# Patient Record
Sex: Female | Born: 1965 | Hispanic: Refuse to answer | Smoking: Never smoker
Health system: Southern US, Community
[De-identification: ages and names within clinical notes are randomized; demographics above are authoritative.]

## PROBLEM LIST (undated history)

## (undated) DIAGNOSIS — N200 Calculus of kidney: Secondary | ICD-10-CM

## (undated) DIAGNOSIS — D696 Thrombocytopenia, unspecified: Principal | ICD-10-CM

## (undated) HISTORY — PX: TUBAL LIGATION: SHX77

## (undated) HISTORY — PX: LITHOTRIPSY: SUR834

## (undated) HISTORY — PX: APPENDECTOMY: SHX54

## (undated) HISTORY — DX: Thrombocytopenia, unspecified: D69.6

## (undated) HISTORY — DX: Calculus of kidney: N20.0

---

## 2009-03-26 ENCOUNTER — Emergency Department (HOSPITAL_COMMUNITY): Admission: EM | Admit: 2009-03-26 | Discharge: 2009-03-27 | Payer: Self-pay | Admitting: Emergency Medicine

## 2010-05-01 LAB — URINALYSIS, ROUTINE W REFLEX MICROSCOPIC
Glucose, UA: NEGATIVE mg/dL
Nitrite: NEGATIVE
Protein, ur: NEGATIVE mg/dL

## 2010-11-17 ENCOUNTER — Emergency Department (HOSPITAL_COMMUNITY)
Admission: EM | Admit: 2010-11-17 | Discharge: 2010-11-18 | Disposition: A | Discharge status: Other Acute Inpatient Hospital | Payer: Self-pay | Source: Home / Self Care | Attending: Emergency Medicine | Admitting: Emergency Medicine

## 2010-11-17 DIAGNOSIS — R142 Eructation: Secondary | ICD-10-CM | POA: Insufficient documentation

## 2010-11-17 DIAGNOSIS — R109 Unspecified abdominal pain: Secondary | ICD-10-CM | POA: Insufficient documentation

## 2010-11-17 DIAGNOSIS — R143 Flatulence: Secondary | ICD-10-CM | POA: Insufficient documentation

## 2010-11-17 DIAGNOSIS — R630 Anorexia: Secondary | ICD-10-CM | POA: Insufficient documentation

## 2010-11-17 DIAGNOSIS — R10819 Abdominal tenderness, unspecified site: Secondary | ICD-10-CM | POA: Insufficient documentation

## 2010-11-17 DIAGNOSIS — R112 Nausea with vomiting, unspecified: Secondary | ICD-10-CM | POA: Insufficient documentation

## 2010-11-17 DIAGNOSIS — R5383 Other fatigue: Secondary | ICD-10-CM | POA: Insufficient documentation

## 2010-11-17 DIAGNOSIS — N201 Calculus of ureter: Secondary | ICD-10-CM | POA: Insufficient documentation

## 2010-11-17 DIAGNOSIS — R141 Gas pain: Secondary | ICD-10-CM | POA: Insufficient documentation

## 2010-11-17 DIAGNOSIS — R5381 Other malaise: Secondary | ICD-10-CM | POA: Insufficient documentation

## 2010-11-17 DIAGNOSIS — K8020 Calculus of gallbladder without cholecystitis without obstruction: Secondary | ICD-10-CM | POA: Insufficient documentation

## 2010-11-17 DIAGNOSIS — N12 Tubulo-interstitial nephritis, not specified as acute or chronic: Secondary | ICD-10-CM | POA: Insufficient documentation

## 2010-11-17 LAB — DIFFERENTIAL
Basophils Relative: 0 % (ref 0–1)
Eosinophils Relative: 1 % (ref 0–5)
Lymphocytes Relative: 6 % — ABNORMAL LOW (ref 12–46)
Lymphs Abs: 0.9 10*3/uL (ref 0.7–4.0)
Monocytes Relative: 6 % (ref 3–12)
Neutro Abs: 13.2 10*3/uL — ABNORMAL HIGH (ref 1.7–7.7)
Neutrophils Relative %: 87 % — ABNORMAL HIGH (ref 43–77)

## 2010-11-17 LAB — BASIC METABOLIC PANEL
BUN: 22 mg/dL (ref 6–23)
CO2: 21 mEq/L (ref 19–32)
GFR calc Af Amer: 50 mL/min — ABNORMAL LOW (ref >90)
Glucose, Bld: 129 mg/dL — ABNORMAL HIGH (ref 70–99)
Potassium: 3.2 mEq/L — ABNORMAL LOW (ref 3.5–5.1)

## 2010-11-18 ENCOUNTER — Emergency Department (HOSPITAL_COMMUNITY): Payer: Self-pay

## 2010-11-18 ENCOUNTER — Inpatient Hospital Stay (HOSPITAL_COMMUNITY)
Admission: AD | Admit: 2010-11-18 | Discharge: 2010-11-19 | DRG: 694 | Disposition: A | Discharge status: Home / Self Care | Payer: Self-pay | Source: Other Acute Inpatient Hospital | Attending: Urology | Admitting: Urology

## 2010-11-18 DIAGNOSIS — N1330 Unspecified hydronephrosis: Secondary | ICD-10-CM | POA: Diagnosis present

## 2010-11-18 DIAGNOSIS — N201 Calculus of ureter: Principal | ICD-10-CM | POA: Diagnosis present

## 2010-11-18 DIAGNOSIS — R109 Unspecified abdominal pain: Secondary | ICD-10-CM | POA: Diagnosis present

## 2010-11-18 DIAGNOSIS — N390 Urinary tract infection, site not specified: Secondary | ICD-10-CM | POA: Diagnosis present

## 2010-11-18 LAB — CBC
MCHC: 34 g/dL (ref 30.0–36.0)
RBC: 4.26 MIL/uL (ref 3.87–5.11)
RDW: 13 % (ref 11.5–15.5)

## 2010-11-18 LAB — HEPATIC FUNCTION PANEL
ALT: 57 U/L — ABNORMAL HIGH (ref 0–35)
AST: 53 U/L — ABNORMAL HIGH (ref 0–37)
Bilirubin, Direct: 0.7 mg/dL — ABNORMAL HIGH (ref 0.0–0.3)
Total Bilirubin: 1.3 mg/dL — ABNORMAL HIGH (ref 0.3–1.2)

## 2010-11-18 LAB — URINALYSIS, ROUTINE W REFLEX MICROSCOPIC
Glucose, UA: NEGATIVE mg/dL
Specific Gravity, Urine: 1.024 (ref 1.005–1.030)
Urobilinogen, UA: 1 mg/dL (ref 0.0–1.0)
pH: 5.5 (ref 5.0–8.0)

## 2010-11-18 MED ORDER — IOHEXOL 300 MG/ML  SOLN
100.0000 mL | Freq: Once | INTRAMUSCULAR | Status: AC | PRN
  Administered 2010-11-18: 100 mL via INTRAVENOUS

## 2010-11-19 LAB — CBC
HCT: 30.7 % — ABNORMAL LOW (ref 36.0–46.0)
Hemoglobin: 10.3 g/dL — ABNORMAL LOW (ref 12.0–15.0)
MCHC: 33.6 g/dL (ref 30.0–36.0)
Platelets: 78 10*3/uL — ABNORMAL LOW (ref 150–400)

## 2010-11-19 LAB — BASIC METABOLIC PANEL
BUN: 17 mg/dL (ref 6–23)
GFR calc Af Amer: 67 mL/min — ABNORMAL LOW (ref >90)
GFR calc non Af Amer: 58 mL/min — ABNORMAL LOW (ref >90)
Sodium: 135 mEq/L (ref 135–145)

## 2010-11-20 LAB — URINE CULTURE
Colony Count: >=100000
Culture  Setup Time: 201210090922

## 2010-11-21 NOTE — Consult Note (Signed)
NAMEINARA, DIKE             ACCOUNT NO.:  [id number]  MEDICAL RECORD NO.:  [id number]  LOCATION:  [facility]                         FACILITY:  [facility]  PHYSICIAN:  Excell Seltzer. Annabell Howells, M.D.    DATE OF BIRTH:  [birth year]  DATE OF CONSULTATION:  11/18/2010 DATE OF DISCHARGE:                                CONSULTATION   CHIEF COMPLAINT:  Left flank pain.  HISTORY:  Ms. Rothman is a 45 year old white female with no prior history of symptomatic stone.  She had onset of having severe left flank pain with nausea who came to emergency room with worsened left upper quadrant.  She had chills and sweats but did not spike a fever.  She also has had a severe headache for 3 days.  She has no new voiding complaints since got admitted.  CT scan in the ER demonstrated 1 or more 5-mm stones in the left proximal ureter with obstruction and this was consistent with urinary tract infection.  Her creatinine is 1.41. __________  PAST MEDICAL HISTORY:  Negative except for appendectomy and a tubal ligation.   SOCIAL HISTORY:  No alcohol.  She works as a Engineer, water.  FAMILY HISTORY:  Noncontributory.  REVIEW OF SYSTEMS:  She has had the headache.  No documented fever but marked chills.  She denies chest pain, shortness of breath, diarrhea, or constipation.  She denies lower extremity edema.  She does have some chronic urinary tract infection and mild stress incontinence.  She is although dry without flank complaints on a full 14-point review of systems.  PHYSICAL EXAMINATION:  VITAL SIGNS:  Blood pressure is 95/54, heart rate 85, and respirations 18. GENERAL:  She is a well-developed, well-nourished white female in no acute distress, alert, and oriented x3. LUNGS:  Clear with normal effort. HEART:  Regular rate and rhythm. ABDOMEN:  Soft, obese, diffusely tender, greatest in left upper quadrant with some guarding.  No mass hepatosplenomegaly or hernias noted.  She has no supraclavicular  or posterior cervical nodes. GU/RECTAL:  Deferred. EXTREMITIES:  Full range of motion without edema. SKIN:  Warm and dry.  IMAGING:  Except her clinical information, I reviewed her CT scan.  She does have 1 and possibly 2 stones in the proximal ureter with a 5 mm cross-sectional diameter although associated with hydro and perinephric stranding.  No additional distal stones are noted.  I did review her CT from last year and there were 2 stones in the kidney at that time, which are no longer present on the current CT.  LABORATORY DATA:  White blood count 15.2, hemoglobin 11.6, and platelet count 85,000.  BNP is normal with the exception of a creatinine 1.41 and glucose of 129.  Urine today is nitrite negative, large leukocyte esterase, large blood, 21-50 red cells, and many bacteria.  IMPRESSION: 1. Left proximal ureteral stone with urinary tract infection, possible     sepsis. 2. Mild renal insufficiency. 3. Thrombocytopenia.  PLAN:  Transfer to [facility] for cystoscopy and stent insertion.  The alternative plan of tube placement was also mentioned.  The risk of the stent including bleeding, infection, ureteral injury, need for percutaneous nephrostomy tube, thrombotic events, and anesthetic complications  were reviewed.  She also was informed that she will need a secondary procedure for actual management of the stones at a later date. She has been given Rocephin and her cultures obtained.  I agree with this and I gave Cipro as well.     Excell Seltzer. Annabell Howells, M.D.     JJW/MEDQ  D:  11/18/2010  T:  11/18/2010  Job:  [id number]  Electronically Signed by Bjorn Pippin M.D. on 11/21/2010 12:58:47 PM

## 2010-11-25 ENCOUNTER — Other Ambulatory Visit: Payer: Self-pay | Admitting: Anesthesiology

## 2010-11-25 ENCOUNTER — Other Ambulatory Visit: Payer: Self-pay | Admitting: Urology

## 2010-11-25 ENCOUNTER — Encounter (HOSPITAL_COMMUNITY): Payer: Self-pay

## 2010-11-25 LAB — SURGICAL PCR SCREEN
MRSA, PCR: NEGATIVE
Staphylococcus aureus: NEGATIVE

## 2010-11-25 LAB — CBC
MCHC: 32.5 g/dL (ref 30.0–36.0)
Platelets: 313 10*3/uL (ref 150–400)
RBC: 3.47 MIL/uL — ABNORMAL LOW (ref 3.87–5.11)
WBC: 14.1 10*3/uL — ABNORMAL HIGH (ref 4.0–10.5)

## 2010-11-26 NOTE — Op Note (Signed)
NAMEDORITA, Veronica Johnson NO.:  [id number]  MEDICAL RECORD NO.:  [id number]  LOCATION:  1415                         FACILITY:  [facility]  PHYSICIAN:  Jerilee Field, MD   DATE OF BIRTH:  Jul 09, 1965  DATE OF PROCEDURE: DATE OF DISCHARGE:                              OPERATIVE REPORT   PREOPERATIVE DIAGNOSES: 1. Urinary tract infection. 2. Left ureteral stone versus stones. 3. Left hydronephrosis.  POSTOPERATIVE DIAGNOSES: 1. Urinary tract infection. 2. Left ureteral stone versus stones. 3. Left hydronephrosis.  PROCEDURE:  Cystoscopy with left retrograde pyelogram and interpretation and left ureteral stent placement.  SURGEON:  Jerilee Field, MD  TYPE OF ANESTHESIA:  General.  INDICATION FOR PROCEDURE:  Veronica Johnson is a 45 year old female with acute onset of left flank pain.  Also, had chills and a dirty UA consistent with infection.  Her blood pressure was on the low side in the ER where she was given antibiotics and bolus.  A CT scan revealed a 5 mm wide by approximately 13 mm tall stone in the left proximal ureter. The left hydroureteronephrosis proximal to this and perinephric stranding.  The stone in the proximal ureter may have been 2 adjacent stones.  I discussed with the patient the CT findings.  We discussed the nature risks, benefits, and alternatives to cystoscopy and stent placement.  All questions were answered.  She elected to proceed.  We did discuss that this was a temporizing measure, but would not treat the stone and it was very important to follow up to prevent stent encrustation and renal damage.  FINDINGS:  On exam the patient had a grade 2 cystocele.  On cystoscopy showed a normal urethra and normal bladder.  Stent was placed without difficulty with a good coil seen in the kidney and a good coil in the bladder.  INTERPRETATION OF LEFT RETROGRADE PYELOGRAM:  The distal mid and proximal ureter appeared normal without filling  defects or dilation. Right above the ureteropelvic junction, the slightly narrowed area and then expanded where the stone was sitting.  There was mild dilation of the ureteropelvic junction with the lower calix visualized.  The patient had received prior p.o. contrast in the descending colon in right across the kidney.  The colon still contain contrast obscuring some of the view.  PROCEDURE:  After consent was obtained.  The patient brought to the operating room.  Time-out was performed to confirm the patient and procedure.  After adequate anesthesia, a time-out SCDs and preop antibiotics, she was placed in lithotomy position and prepped and draped in the usual fashion.  A rigid cystoscope was passed per urethra.  The bladder was inspected.  The left ureteral orifice was visualized.  It was cannulated with a 6-French open-ended catheter and gentle injection of retrograde contrast was performed via the catheter with the findings previously dictated.  Next, a Sensor wire was advanced, this appeared to push the stone back into the kidney, we removed out of the way.  The wire did coil in the upper pole calix.  Over the wire, a 6 x 24 cm stent was placed.  The wire was partially removed and a good coil was seen in the kidney.  The wire was completely removed and there was a good coil in the bladder.  Some proteinaceous urine drain through the stent after placement.  The patient's bladder was full and the scope was removed.  A 16-French Foley was placed simply to leave it overnight to maximally drain the bladder and kidney and to prevent reflux up the stent throughout the night and the Foley will be removed in the morning.  ESTIMATED BLOOD LOSS:  Minimal.  COMPLICATIONS:  None.  SPECIMENS:  None.  DISPOSITION:  Patient stable to PACU.  She did remain stable throughout the case without the need for pressors.          ______________________________ Jerilee Field, MD     ME/MEDQ   D:  11/18/2010  T:  11/19/2010  Job:  [id number]  Electronically Signed by Jerilee Field MD on 11/26/2010 01:07:13 PM

## 2010-11-27 ENCOUNTER — Ambulatory Visit (HOSPITAL_COMMUNITY): Payer: Self-pay

## 2010-11-27 ENCOUNTER — Ambulatory Visit (HOSPITAL_COMMUNITY)
Admission: RE | Admit: 2010-11-27 | Discharge: 2010-11-27 | Disposition: A | Discharge status: Home / Self Care | Payer: Self-pay | Source: Ambulatory Visit | Attending: Urology | Admitting: Urology

## 2010-11-27 DIAGNOSIS — E669 Obesity, unspecified: Secondary | ICD-10-CM | POA: Insufficient documentation

## 2010-11-27 DIAGNOSIS — Z01812 Encounter for preprocedural laboratory examination: Secondary | ICD-10-CM | POA: Insufficient documentation

## 2010-11-27 DIAGNOSIS — N201 Calculus of ureter: Secondary | ICD-10-CM | POA: Insufficient documentation

## 2010-12-02 NOTE — Op Note (Signed)
Veronica Johnson, Veronica Johnson             ACCOUNT NO.:  [id number]  MEDICAL RECORD NO.:  [id number]  LOCATION:  [facility]                         FACILITY:  [facility]  PHYSICIAN:  Excell Seltzer. Annabell Howells, M.D.    DATE OF BIRTH:  1965/12/07  DATE OF PROCEDURE:  11/27/2010 DATE OF DISCHARGE:                              OPERATIVE REPORT   PROCEDURE:  Cystoscopy, removal of left double-J stent.  Laparoscopic stone extraction with holmium lasertripsy and insertion of left double-J stent.  POSTOPERATIVE DIAGNOSIS:  Left ureteral stones.  POSTOPERATIVE DIAGNOSIS:  Left ureteral stones.  SURGEON:  Excell Seltzer. Annabell Howells, M.D.  ANESTHESIA:  General.  SPECIMEN:  Stone fragments.  DRAINS:  6-French 24 cm double-J stent on the left.  COMPLICATIONS:  None.  INDICATIONS:  Veronica Johnson is a 45 year old white female, who originally presented to the hospital with asymptomatic stone with possible UTI and underwent stent placement on November 18, 2010. She returns now for ureteroscopy.  FINDINGS AND PROCEDURE:  She was given Cipro, she was taken to the operating room, where general anesthetic was induced.  She was placed in lithotomy position.  Perineum and genitalia area were prepped with Betadine solution and the all important time-out was performed. Cystoscopy was performed using a 22-French scope and 12-degree lens. The stent was visualized coming from the left ureteral orifice.  This was grasped and removed to the urethral meatus. The guidewire was then passed to the kidney through the stent and the stent was removed.  The stones were visualized along the proximal portion of the stent.  At this point, a 6-French short ureteroscope was inserted alongside of the wire; however, it bypassed the intramural ureter.  The ureter had to be dilated, 12-French with the introducer sheath inner core.  The ureteroscope was then inserted and inspection of the distal ureter revealed a small stone approximately 4 mm in size, which  was removed with a Nitinol basket.  I then attempted to access the stone in the proximal ureter with the rigid scope, but could not get by a couple of small valves in the proximal ureter and the stones actually flushed back to the kidney.  At this point, a 35-cm access sheath was then passed over the wire to the kidney.  The inner core wire were removed and a digital flexible ureteroscope was then inserted to the kidney.  The stones were visualized in the mid to lower pole calyx and were engaged with a 200 micron holmium laser fiber.  The energy was set on 0.5 watts with the frequency at 20 Hz.  The stones were fragmented into manageable fragments.  At this point, the stone fragments were removed with a Nitinol basket.  Once all stone fragments had been removed, thorough inspection of the kidney revealed nothing larger than tiny sand particles.  A guidewire was inserted back through the access sheath to the kidney and the access sheath was removed.  At this point, the cystoscope was inserted and the access sheath and a 6- French 24 cm double-J stent with string was inserted over the wire to the kidney under fluoroscopic guidance.  The wire was removed leaving good coil in the kidney, a good coil in the  bladder.  The bladder was drained.  The cystoscope was removed leaving stent string exiting the urethra.  The string was secured to the patient's inner thigh with tape.  At this point, the patient was taken down from lithotomy position.  Her anesthetic was reversed.  She was moved to recovery in stable condition. There were no complications.     Excell Seltzer. Annabell Howells, M.D.     JJW/MEDQ  D:  11/27/2010  T:  11/27/2010  Job:  [id number]  Electronically Signed by Bjorn Pippin M.D. on 12/02/2010 08:39:12 AM

## 2010-12-08 NOTE — H&P (Signed)
  Veronica Johnson, KOT             ACCOUNT NO.:  [id number]  MEDICAL RECORD NO.:  [id number]  LOCATION:  [facility]                         FACILITY:  [facility]  PHYSICIAN:  Excell Seltzer. Annabell Howells, M.D.    DATE OF BIRTH:  1965-09-06  DATE OF ADMISSION:  11/27/2010 DATE OF DISCHARGE:  11/27/2010                             HISTORY & PHYSICAL   CHIEF COMPLAINT:  Left flank pain.  HISTORY:  Veronica Johnson is a 45 year old white female who was seen on November 18, 2010, with a left ureteral stone in the proximal ureter.  She has also had chills and sweats and there was concern for urinary tract infection at that time, so stent was placed.  She returns now for definitive ureteroscopic management of her stone.  PAST HISTORY:  Significant for tubal ligation.  SOCIAL HISTORY:  No alcohol.  She works as a Engineer, water.  FAMILY HISTORY:  Noncontributory.  Currently on antibiotic and pain medication.  REVIEW OF SYSTEMS:  She has some stent irritation, but is otherwise entirely without complaints.  PHYSICAL EXAMINATION:  GENERAL:  She is a well-developed, well- nourished, white female, in no acute distress. VITAL SIGNS:  Her most recent blood pressure is 95/54, the heart rate of 85, respirations 18. LUNGS:  Clear with normal effort. HEART:  Regular rate and rhythm. ABDOMEN:  Soft, obese with left upper quadrant tenderness.  No mass hepatosplenomegaly or hernias noted. EXTREMITIES:  Full range of motion without edema. SKIN:  Warm and dry.  IMPRESSION:  Left proximal ureteral stones with a history of urinary tract infection.  PLAN:  She will undergo definitive ureteroscopic stone removal today.     Excell Seltzer. Annabell Howells, M.D.     JJW/MEDQ  D:  12/02/2010  T:  12/02/2010  Job:  [id number]  Electronically Signed by Bjorn Pippin M.D. on 12/08/2010 08:00:32 AM

## 2010-12-08 NOTE — Discharge Summary (Signed)
  NAMECARLEAN, CROWL             ACCOUNT NO.:  [id number]  MEDICAL RECORD NO.:  [id number]  LOCATION:  [facility]                         FACILITY:  [facility]  PHYSICIAN:  Excell Seltzer. Annabell Howells, M.D.    DATE OF BIRTH:  [birth year]  DATE OF ADMISSION:  11/27/2010 DATE OF DISCHARGE:  11/27/2010                              DISCHARGE SUMMARY   Briefly, Ms. Gamboa is a 45 year old white female who was admitted for ureteroscopy for left proximal ureteral stones.  She had a stent placed approximately a week prior.  HOSPITAL COURSE:  She was taken to the operating room and underwent ureteroscopy with laser tripsy of her left ureteral stones and a left double-J stent with string was inserted at the end of the procedure. She tolerated this well and after recovery, was discharged home.  DISCHARGE MEDICATIONS:  Include Cipro and ibuprofen as well as fish oil and vitamin B12.  Additional Cipro was called in as well as fluconazole 150 mg daily.  She was instructed to pull her stent by the attached string on Monday, the 22 and she will follow up with [place] on October 29 at 4 p.m.  There were no complications during admission.  DISPOSITION:  Home.  CONDITION:  Good.     Excell Seltzer. Annabell Howells, M.D.     JJW/MEDQ  D:  12/02/2010  T:  12/02/2010  Job:  [id number]  Electronically Signed by Bjorn Pippin M.D. on 12/08/2010 08:00:29 AM

## 2013-05-16 IMAGING — CR DG ABDOMEN 1V
1 series · 1 of 1 positions shown · non-contrast
Comparison: CT on 11/18/2010

CLINICAL DATA: Left ureteral calculus.

ABDOMEN - 1 VIEW

[t abdomen supine]
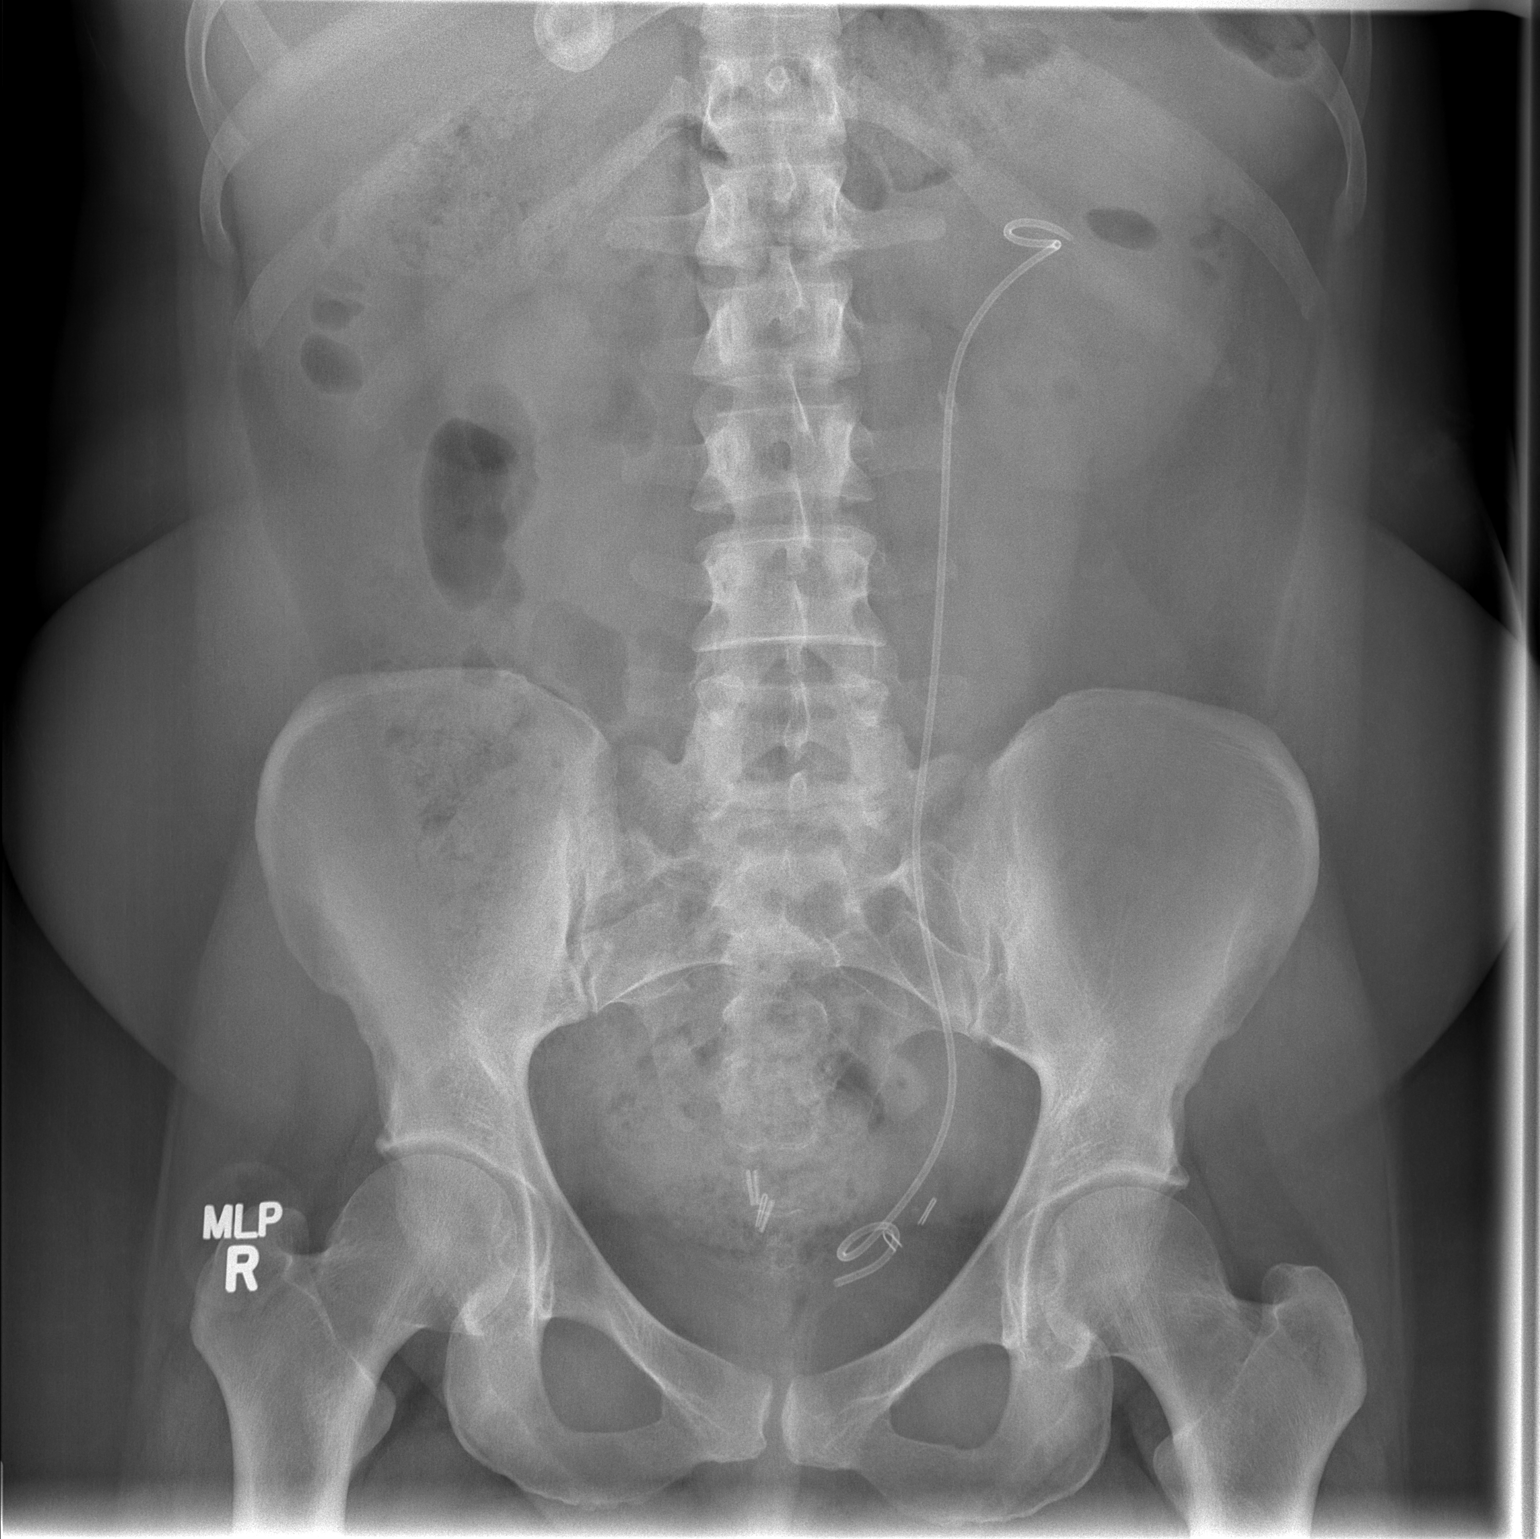

[1 of 1 positions shown; findings below may reference images not displayed]

FINDINGS: Left ureteral stent is seen in appropriate position.  A
radiopaque calculus is again seen adjacent to the stent at the
expected level of the left ureteropelvic junction.  This measures
approximately 5 x 8 mm.  No other radiopaque calculi seen along the
course of the urinary tract.

Surgical clips are seen within the pelvis.  A large calcified
gallstone is also noted.  The bowel gas pattern normal.
IMPRESSION: 1.  5 x 8 mm calculus at the left ureteropelvic junction.
2.  Left ureteral stent in appropriate position.
3.  Cholelithiasis incidentally noted.

## 2014-05-10 ENCOUNTER — Other Ambulatory Visit (HOSPITAL_COMMUNITY): Payer: Self-pay | Admitting: Radiology

## 2014-05-10 DIAGNOSIS — R059 Cough, unspecified: Secondary | ICD-10-CM

## 2014-05-10 DIAGNOSIS — R05 Cough: Secondary | ICD-10-CM

## 2014-05-23 ENCOUNTER — Other Ambulatory Visit: Payer: Self-pay | Admitting: Internal Medicine

## 2014-05-23 ENCOUNTER — Other Ambulatory Visit (HOSPITAL_COMMUNITY)
Admission: RE | Admit: 2014-05-23 | Discharge: 2014-05-23 | Disposition: A | Discharge status: Home / Self Care | Payer: 59 | Source: Ambulatory Visit | Attending: Internal Medicine | Admitting: Internal Medicine

## 2014-05-23 DIAGNOSIS — Z1151 Encounter for screening for human papillomavirus (HPV): Secondary | ICD-10-CM | POA: Insufficient documentation

## 2014-05-23 DIAGNOSIS — Z01419 Encounter for gynecological examination (general) (routine) without abnormal findings: Secondary | ICD-10-CM | POA: Diagnosis present

## 2014-05-25 LAB — CYTOLOGY - PAP

## 2014-05-30 ENCOUNTER — Encounter (HOSPITAL_COMMUNITY): Payer: Self-pay

## 2014-06-08 ENCOUNTER — Encounter (HOSPITAL_COMMUNITY): Payer: Self-pay | Admitting: Hematology & Oncology

## 2014-06-08 ENCOUNTER — Ambulatory Visit (HOSPITAL_COMMUNITY): Payer: Self-pay | Admitting: Hematology & Oncology

## 2014-06-08 ENCOUNTER — Encounter (HOSPITAL_COMMUNITY): Payer: 59 | Attending: Hematology & Oncology | Admitting: Hematology & Oncology

## 2014-06-08 VITALS — BP 146/79 | HR 85 | Temp 97.9°F | Resp 16 | Ht 63.25 in | Wt 186.0 lb

## 2014-06-08 DIAGNOSIS — D693 Immune thrombocytopenic purpura: Secondary | ICD-10-CM | POA: Diagnosis present

## 2014-06-08 DIAGNOSIS — D696 Thrombocytopenia, unspecified: Secondary | ICD-10-CM | POA: Diagnosis not present

## 2014-06-08 LAB — CBC WITH DIFFERENTIAL/PLATELET
BASOS ABS: 0.1 10*3/uL (ref 0.0–0.1)
Basophils Relative: 1 % (ref 0–1)
Eosinophils Absolute: 0.3 10*3/uL (ref 0.0–0.7)
Eosinophils Relative: 4 % (ref 0–5)
HCT: 37.2 % (ref 36.0–46.0)
HEMOGLOBIN: 12 g/dL (ref 12.0–15.0)
Lymphocytes Relative: 33 % (ref 12–46)
Lymphs Abs: 2.8 10*3/uL (ref 0.7–4.0)
MCH: 27 pg (ref 26.0–34.0)
MCHC: 32.3 g/dL (ref 30.0–36.0)
MCV: 83.8 fL (ref 78.0–100.0)
MONOS PCT: 6 % (ref 3–12)
Monocytes Absolute: 0.5 10*3/uL (ref 0.1–1.0)
NEUTROS ABS: 4.8 10*3/uL (ref 1.7–7.7)
NEUTROS PCT: 56 % (ref 43–77)
PLATELETS: 107 10*3/uL — AB (ref 150–400)
RBC: 4.44 MIL/uL (ref 3.87–5.11)
RDW: 12.8 % (ref 11.5–15.5)
WBC: 8.5 10*3/uL (ref 4.0–10.5)

## 2014-06-08 NOTE — Patient Instructions (Signed)
[facility] Discharge Instructions  RECOMMENDATIONS MADE BY THE CONSULTANT AND ANY TEST RESULTS WILL BE SENT TO YOUR REFERRING PHYSICIAN.  Exam and discussion by Dr. Galen ManilaPenland.   Will check labs today.  Report unexplained bruising or bleeding.  Follow-up with labs in 3 and 6 months and office visit in 6 months.  Thank you for choosing [facility] to provide your oncology and hematology care.  To afford each patient quality time with our provider, please arrive at least 15 minutes before your scheduled appointment time.    You need to re-schedule your appointment should you arrive 10 or more minutes late.  We strive to give you quality time with our providers, and arriving late affects you and other patients whose appointments are after yours.  Also, if you no show three or more times for appointments you may be dismissed from the clinic at the providers discretion.     Again, thank you for choosing [facility].  Our hope is that these requests will decrease the amount of time that you wait before being seen by our physicians.       _____________________________________________________________  Should you have questions after your visit to [facility], please contact our office at [phone] between the hours of 8:30 a.m. and 4:30 p.m.  Voicemails left after 4:30 p.m. will not be returned until the following business day.  For prescription refill requests, have your pharmacy contact our office.

## 2014-06-08 NOTE — Progress Notes (Addendum)
[facility] CONSULT NOTE  CHIEF COMPLAINTS/PURPOSE OF CONSULTATION:  Thrombocytopenia  HISTORY OF PRESENTING ILLNESS:  Veronica Johnson 49 y.o. female.is here today for evaluation of thrombocytopenia. She has a fairly unremarkable medical history. She has had kidney stones in the past. She denies a history of low platelets. She denies easy bruising or bleeding. She denies any recent illness. She notes she was in [place] in February and was bitten "by a bug." on the left shoulder. She notes it has currently healed without difficulty.   She went for a routine physical exam and was advised her platelets were low. She has been referred here for additional evaluation. Laboratory studies on 05/23/2014 show a normal hgb/hct and wbc count. Platelet count was 85K. Laboratory studies from 05/09/2014 show normal counts, platelets were 221K. Interestingly laboratory studies dating back to 2012 show a thrombocytopenia but the patient was admitted to the inpatient service for kidney stones.  Hasn't taken any supplements for 3 weeks. Hobbies include sky diving, riding motorcycles, traveling the world and white water rafting  Never had a mammogram nor a colonoscopy. She notes that both procedures are scheduled.   MEDICAL HISTORY:  Past Medical History  Diagnosis Date  . Kidney stone on left side     SURGICAL HISTORY: Past Surgical History  Procedure Laterality Date  . Lithotripsy    . Appendectomy    . Tubal ligation      SOCIAL HISTORY: History   Social History  . Marital Status: Legally Separated    Spouse Name: N/A  . Number of Children: N/A  . Years of Education: N/A   Occupational History  . Not on file.   Social History Main Topics  . Smoking status: Never Smoker   . Smokeless tobacco: Never Used  . Alcohol Use: No  . Drug Use: No  . Sexual Activity: Yes    Birth Control/ Protection: Surgical   Other Topics Concern  . 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 Was in [place] in February.   FAMILY HISTORY: Family History  Problem Relation Age of Onset  . Cancer Father    indicated that her mother is alive. She indicated that her father is deceased.   Dad died of throat cancer in 31s Mother is 62 Half siblings all healthy  ALLERGIES:  has no allergies on file.  MEDICATIONS:  Current Outpatient Prescriptions  Medication Sig Dispense Refill  . Cholecalciferol (VITAMIN D PO) Take 1 capsule by mouth daily.    . Cyanocobalamin (B-12 PO) Take 1 tablet by mouth daily.    . IRON PO Take 1 tablet by mouth daily.    . Omega-3 Fatty Acids (FISH OIL PO) Take 2 capsules by mouth daily.     No current facility-administered medications for this visit.    Review of Systems  Constitutional: Negative for fever, chills and weight loss.  HENT: Negative for hearing loss.   Eyes: Positive for blurred vision. Negative for double vision.  Respiratory: Positive for cough. Negative for sputum production, shortness of breath and wheezing.        Cough since 2012  Cardiovascular: Positive for palpitations. Negative for chest pain, orthopnea, claudication, leg swelling and PND.  Gastrointestinal: Positive for constipation. Negative for heartburn, nausea, vomiting, abdominal pain, diarrhea and blood in stool.  Genitourinary: Negative for  dysuria, urgency, frequency and hematuria.  Musculoskeletal: Negative for myalgias, back pain, joint pain, falls and neck pain.  Skin: Negative for rash.  Neurological: Negative for dizziness, tingling, tremors, sensory change, speech change, focal weakness, seizures, loss of consciousness and headaches.  Endo/Heme/Allergies: Negative.   Psychiatric/Behavioral: Negative for depression. The patient has insomnia.     PHYSICAL EXAMINATION: ECOG PERFORMANCE STATUS:  0 - Asymptomatic  Filed Vitals:   06/08/14 1416  BP: 146/79  Pulse: 85  Temp: 97.9 F (36.6 C)  Resp: 16   Filed Weights   06/08/14 1416  Weight: 186 lb (84.369 kg)    Physical Exam  Constitutional: She is oriented to person, place, and time and well-developed, well-nourished, and in no distress.  HENT:  Head: Normocephalic and atraumatic.  Nose: Nose normal.  Mouth/Throat: Oropharynx is clear and moist. No oropharyngeal exudate.  Eyes: Conjunctivae and EOM are normal. Pupils are equal, round, and reactive to light. Right eye exhibits no discharge. Left eye exhibits no discharge. No scleral icterus.  Neck: Normal range of motion. Neck supple. No tracheal deviation present. No thyromegaly present.  Cardiovascular: Normal rate, regular rhythm and normal heart sounds.  Exam reveals no gallop and no friction rub.   No murmur heard. Pulmonary/Chest: Effort normal and breath sounds normal. She has no wheezes. She has no rales.  Abdominal: Soft. Bowel sounds are normal. She exhibits no distension and no mass. There is no tenderness. There is no rebound and no guarding.  Musculoskeletal: Normal range of motion. She exhibits no edema.  Lymphadenopathy:    She has no cervical adenopathy.  Neurological: She is alert and oriented to person, place, and time. She has normal reflexes. No cranial nerve deficit. Gait normal. Coordination normal.  Skin: Skin is warm and dry. No rash noted.  Psychiatric: Mood, memory, affect and judgment normal.  Nursing note and vitals reviewed.    LABORATORY DATA:  I have reviewed the data as listed Lab Results  Component Value Date   WBC 8.5 06/08/2014   HGB 12.0 06/08/2014   HCT 37.2 06/08/2014   MCV 83.8 06/08/2014   PLT 107* 06/08/2014     ASSESSMENT & PLAN:  Thrombocytopenia  I discussed with the patient that thrombocytopenia can be associated with a variety of conditions. Given that her this was an incidental finding it is most likely not  "life-threatening" and secondary to immune mediated causes or potential medication exposure. She does use multiple supplements but has help them now for the last several weeks.  Does not have a known history of occult liver disease. Her other blood counts do not lead [place] to suspect an MDS. New onset thrombocytopenia rules out the likelihood of congenital thrombocytopenia. She does not give a history that would suggest HIV or HCV infection although it is felt that patients should be tested with a new onset thrombocytopenia.  We will confirm thrombocytopenia by repeating the CBC and reviewing the peripheral blood smear; obtain prior platelet counts, if available, and assess other hematologic abnormalities.  No additional testing is needed today except for repeat CBC and peripheral smear review. I have recommended to the patient repeating another blood count in 8-12 weeks. She expresses concern with missing work and I advised her we can follow her counts on a regular basis and plan on seeing her back every 6 months. If she has immune mediated disease this will need ongoing observation. There is currently no indication for bone marrow biopsy. In regards to HIV or HCV testing  we can address this again at follow-up.  Orders Placed This Encounter  Procedures  . CBC with Differential    Standing Status: Standing     Number of Occurrences: 8     Standing Expiration Date: 06/07/2016    All questions were answered. The patient knows to call the clinic with any problems, questions or concerns.  This note was electronically signed.   I have reviewed the above documentation for accuracy and completeness and I agree with the above.  This document serves as a record of services personally performed by Ancil Linsey, MD. It was created on her behalf by Pearlie Oyster, a trained medical scribe. The creation of this record is based on the scribe's personal observations and the provider's statements to them. This  document has been checked and approved by the attending provider.    Molli Hazard, MD

## 2014-06-30 ENCOUNTER — Encounter (HOSPITAL_COMMUNITY): Payer: Self-pay | Admitting: Hematology & Oncology

## 2014-09-07 ENCOUNTER — Encounter (HOSPITAL_COMMUNITY): Payer: 59 | Attending: Hematology & Oncology

## 2014-09-07 ENCOUNTER — Encounter (HOSPITAL_COMMUNITY): Payer: 59

## 2014-09-07 DIAGNOSIS — D693 Immune thrombocytopenic purpura: Secondary | ICD-10-CM | POA: Diagnosis present

## 2014-09-07 LAB — CBC WITH DIFFERENTIAL/PLATELET
BASOS ABS: 0.1 10*3/uL (ref 0.0–0.1)
Basophils Relative: 1 % (ref 0–1)
EOS ABS: 0.6 10*3/uL (ref 0.0–0.7)
Eosinophils Relative: 7 % — ABNORMAL HIGH (ref 0–5)
HCT: 36.6 % (ref 36.0–46.0)
Hemoglobin: 11.9 g/dL — ABNORMAL LOW (ref 12.0–15.0)
LYMPHS ABS: 3 10*3/uL (ref 0.7–4.0)
Lymphocytes Relative: 38 % (ref 12–46)
MCH: 27 pg (ref 26.0–34.0)
MCHC: 32.5 g/dL (ref 30.0–36.0)
MCV: 83 fL (ref 78.0–100.0)
Monocytes Absolute: 0.4 10*3/uL (ref 0.1–1.0)
Monocytes Relative: 5 % (ref 3–12)
NEUTROS PCT: 50 % (ref 43–77)
Neutro Abs: 3.9 10*3/uL (ref 1.7–7.7)
Platelets: 99 10*3/uL — ABNORMAL LOW (ref 150–400)
RBC: 4.41 MIL/uL (ref 3.87–5.11)
RDW: 13.1 % (ref 11.5–15.5)
WBC: 7.9 10*3/uL (ref 4.0–10.5)

## 2014-12-09 ENCOUNTER — Encounter (HOSPITAL_COMMUNITY): Payer: Self-pay | Admitting: Oncology

## 2014-12-09 DIAGNOSIS — D696 Thrombocytopenia, unspecified: Secondary | ICD-10-CM

## 2014-12-09 HISTORY — DX: Thrombocytopenia, unspecified: D69.6

## 2014-12-09 NOTE — Assessment & Plan Note (Addendum)
Thrombocytopenia, incidentally discovered.  Causes could be immune-mediated versus potential medication exposure versus fatty infiltration of liver versus hypersplenism. She does use multiple supplements.  Chart is reviewed in detail and CT abd/pelvis imaging from 2012 demonstrates and enlarged spleen and fatty infiltration of liver.  I will update imaging with US imaging prior to her next follow-up appointment.  Further lab testing: CBC diff, Hepatitis panel, and HIV antibody.  Rx for Z-PAK with 1 refill in the event that she may relapse during her travels.  Rx for Tessalon 200 mg.  Rx for Diamox 125 mg BID starting the day prior to ascent and continuing for 3 days.  She will be traveling to [place] for 10 days at 14,000 feet.  She will not have access to medical care.  Labs every 3 months: CBC diff  Return in 6 months for follow-up.

## 2014-12-10 ENCOUNTER — Encounter (HOSPITAL_COMMUNITY): Payer: Self-pay | Admitting: Oncology

## 2014-12-10 ENCOUNTER — Encounter (HOSPITAL_COMMUNITY): Payer: 59 | Attending: Oncology

## 2014-12-10 ENCOUNTER — Ambulatory Visit (HOSPITAL_COMMUNITY): Payer: 59 | Admitting: Hematology & Oncology

## 2014-12-10 ENCOUNTER — Encounter (HOSPITAL_BASED_OUTPATIENT_CLINIC_OR_DEPARTMENT_OTHER): Payer: 59 | Admitting: Oncology

## 2014-12-10 VITALS — BP 156/99 | HR 76 | Temp 98.0°F | Resp 22 | Wt 199.3 lb

## 2014-12-10 DIAGNOSIS — T7020XA Unspecified effects of high altitude, initial encounter: Secondary | ICD-10-CM

## 2014-12-10 DIAGNOSIS — R05 Cough: Secondary | ICD-10-CM

## 2014-12-10 DIAGNOSIS — R067 Sneezing: Secondary | ICD-10-CM

## 2014-12-10 DIAGNOSIS — D696 Thrombocytopenia, unspecified: Secondary | ICD-10-CM

## 2014-12-10 DIAGNOSIS — D693 Immune thrombocytopenic purpura: Secondary | ICD-10-CM | POA: Diagnosis not present

## 2014-12-10 DIAGNOSIS — J069 Acute upper respiratory infection, unspecified: Secondary | ICD-10-CM | POA: Diagnosis not present

## 2014-12-10 DIAGNOSIS — R059 Cough, unspecified: Secondary | ICD-10-CM

## 2014-12-10 DIAGNOSIS — T7029XA Other effects of high altitude, initial encounter: Secondary | ICD-10-CM

## 2014-12-10 LAB — CBC WITH DIFFERENTIAL/PLATELET
Basophils Absolute: 0.1 10*3/uL (ref 0.0–0.1)
Basophils Relative: 1 %
Eosinophils Absolute: 0.8 10*3/uL — ABNORMAL HIGH (ref 0.0–0.7)
Eosinophils Relative: 8 %
HCT: 36.5 % (ref 36.0–46.0)
Hemoglobin: 11.9 g/dL — ABNORMAL LOW (ref 12.0–15.0)
LYMPHS ABS: 3.7 10*3/uL (ref 0.7–4.0)
LYMPHS PCT: 39 %
MCH: 27.2 pg (ref 26.0–34.0)
MCHC: 32.6 g/dL (ref 30.0–36.0)
MCV: 83.5 fL (ref 78.0–100.0)
Monocytes Absolute: 0.4 10*3/uL (ref 0.1–1.0)
Monocytes Relative: 4 %
Neutro Abs: 4.6 10*3/uL (ref 1.7–7.7)
Neutrophils Relative %: 48 %
PLATELETS: 122 10*3/uL — AB (ref 150–400)
RBC: 4.37 MIL/uL (ref 3.87–5.11)
RDW: 13.2 % (ref 11.5–15.5)
WBC: 9.7 10*3/uL (ref 4.0–10.5)

## 2014-12-10 MED ORDER — AZITHROMYCIN 250 MG PO TABS: ORAL_TABLET | ORAL | Status: DC

## 2014-12-10 MED ORDER — BENZONATATE 200 MG PO CAPS: 200.0000 mg | ORAL_CAPSULE | Freq: Three times a day (TID) | ORAL | Status: AC | PRN

## 2014-12-10 MED ORDER — ACETAZOLAMIDE 125 MG PO TABS: 125.0000 mg | ORAL_TABLET | Freq: Three times a day (TID) | ORAL | Status: AC

## 2014-12-10 NOTE — Progress Notes (Signed)
No primary care provider on file. No primary provider on file.  Thrombocytopenia (HCC) - Plan: Hepatitis panel, acute, HIV antibody (with reflex), US Abdomen Limited  URI (upper respiratory infection) - Plan: azithromycin (ZITHROMAX) 250 MG tablet  Cough - Plan: benzonatate (TESSALON) 200 MG capsule  High altitude sickness, initial encounter - Plan: acetaZOLAMIDE (DIAMOX) 125 MG tablet  CURRENT THERAPY: Observation  INTERVAL HISTORY: Veronica Johnson 49 y.o. female returns for followup of thrombocytopenia, incidentally discovered.  On chart review, she is noted to have splenomegaly and fatty infiltration of liver on CT imaging in 2012.   I personally reviewed and went over laboratory results with the patient.  The results are noted within this dictation.  Her platelet count is stable at 122,000 today.  She notes a cough that is productive of yellow sputum.  She notes a sore throat with post-nasal drip.  She reports sinus pressure as well.  She is sneezing with yellow phlegm too.  She denies any documented fevers.  Her cough keeps her up at night.  I personally reviewed and went over radiographic studies with the patient.  The results are noted within this dictation.  I have reviewed her old CT imaging in 2012 that demonstrated an ULN or mildly enlarged spleen with fatty infiltration of liver.  She is provided education regarding each.  She is appreciative this information and how these conditions may be contributing to her thrombocytopenia.  She is leaving on 11/18 to go to [place].  She will be at a high elevation and she is concerned about high altitude sickness as she experienced this in past travels at high elevations.  She asks about Diamox.  This medication, from a hematologic standpoint, can cause thrombocytopenia.  I do not see a primary care provider listed in [facility].      Past Medical History  Diagnosis Date  . Kidney stone on left side   . Thrombocytopenia (HCC)  12/09/2014    has Thrombocytopenia (HCC) on her problem list.     has no allergies on file.  Current Outpatient Prescriptions on File Prior to Visit  Medication Sig Dispense Refill  . Cholecalciferol (VITAMIN D PO) Take 1 capsule by mouth daily.    . Cyanocobalamin (B-12 PO) Take 1 tablet by mouth daily.    . IRON PO Take 1 tablet by mouth daily.    . Omega-3 Fatty Acids (FISH OIL PO) Take 2 capsules by mouth daily.     No current facility-administered medications on file prior to visit.    Past Surgical History  Procedure Laterality Date  . Lithotripsy    . Appendectomy    . Tubal ligation      Denies any headaches, dizziness, double vision, fevers, chills, night sweats, nausea, vomiting, diarrhea, constipation, chest pain, heart palpitations, shortness of breath, blood in stool, black tarry stool, urinary pain, urinary burning, urinary frequency, hematuria.   PHYSICAL EXAMINATION  ECOG PERFORMANCE STATUS: 1 - Symptomatic but completely ambulatory  Filed Vitals:   12/10/14 1315  BP: 156/99  Pulse: 76  Temp: 98 F (36.7 C)  Resp: 22    GENERAL:alert, no distress, well nourished, well developed, comfortable, cooperative, obese, smiling and unaccompanied SKIN: skin color, texture, turgor are normal, no rashes or significant lesions HEAD: Normocephalic, No masses, lesions, tenderness or abnormalities EYES: normal, PERRLA, EOMI, Conjunctiva are pink and non-injected EARS: External ears normal OROPHARYNX:posterior pharynx erythema  NECK: supple, no adenopathy, trachea midline LYMPH:  no palpable  lymphadenopathy BREAST:not examined LUNGS: clear to auscultation and percussion HEART: regular rate & rhythm, no murmurs and no gallops ABDOMEN:abdomen soft, non-tender and normal bowel sounds BACK: Back symmetric, no curvature. EXTREMITIES:less then 2 second capillary refill, no joint deformities, effusion, or inflammation, no skin discoloration, no cyanosis  NEURO: alert &  oriented x 3 with fluent speech, no focal motor/sensory deficits, gait normal   LABORATORY DATA: CBC    Component Value Date/Time   WBC 9.7 12/10/2014 1304   RBC 4.37 12/10/2014 1304   HGB 11.9* 12/10/2014 1304   HCT 36.5 12/10/2014 1304   PLT 122* 12/10/2014 1304   MCV 83.5 12/10/2014 1304   MCH 27.2 12/10/2014 1304   MCHC 32.6 12/10/2014 1304   RDW 13.2 12/10/2014 1304   LYMPHSABS 3.7 12/10/2014 1304   MONOABS 0.4 12/10/2014 1304   EOSABS 0.8* 12/10/2014 1304   BASOSABS 0.1 12/10/2014 1304      Chemistry      Component Value Date/Time   NA 135 11/19/2010 0457   K 3.7 11/19/2010 0457   CL 104 11/19/2010 0457   CO2 25 11/19/2010 0457   BUN 17 11/19/2010 0457   CREATININE 1.11* 11/19/2010 0457      Component Value Date/Time   CALCIUM 9.0 11/19/2010 0457   ALKPHOS 150* 11/18/2010 0213   AST 53* 11/18/2010 0213   ALT 57* 11/18/2010 0213   BILITOT 1.3* 11/18/2010 0213        PENDING LABS:   RADIOGRAPHIC STUDIES:  No results found.   11/18/2010 CT abd/pelvis: Low attenuation of the liver suggests fatty infiltration.  Spleen is upper normal limits to mildly enlarged at 13 cm.   PATHOLOGY:    ASSESSMENT AND PLAN:  Thrombocytopenia (HCC) Thrombocytopenia, incidentally discovered.  Causes could be immune-mediated versus potential medication exposure versus fatty infiltration of liver versus hypersplenism. She does use multiple supplements.  Chart is reviewed in detail and CT abd/pelvis imaging from 2012 demonstrates and enlarged spleen and fatty infiltration of liver.  I will update imaging with US imaging prior to her next follow-up appointment.  Further lab testing: CBC diff, Hepatitis panel, and HIV antibody.  Rx for Z-PAK with 1 refill in the event that she may relapse during her travels.  Rx for Tessalon 200 mg.  Rx for Diamox 125 mg BID starting the day prior to ascent and continuing for 3 days.  She will be traveling to [place] for 10 days at 14,000  feet.  She will not have access to medical care.  Labs every 3 months: CBC diff  Return in 6 months for follow-up.    THERAPY PLAN:  Continue with observation.  All questions were answered. The patient knows to call the clinic with any problems, questions or concerns. We can certainly see the patient much sooner if necessary.  Patient and plan discussed with Dr. Loma Messing and she is in agreement with the aforementioned.   This note is electronically signed by: Dellis Anes, PA-C 12/10/2014 4:01 PM

## 2014-12-10 NOTE — Patient Instructions (Signed)
[facility] at [facility] Discharge Instructions  RECOMMENDATIONS MADE BY THE CONSULTANT AND ANY TEST RESULTS WILL BE SENT TO YOUR REFERRING PHYSICIAN.  Exam and discussion by Dellis Aneshomas Kefalas, PA-C Will give you a prescription for Diamox  - take 1 day prio to ascent and take 2 - 3 days while at higher elevations and then D/C Zpak and Tessalon prescribed - take as directed.  Labs in 3 & 6 months Ultrasound before return appointment. Office visit in 6 months.  Thank you for choosing [facility] at [facility] to provide your oncology and hematology care.  To afford each patient quality time with our provider, please arrive at least 15 minutes before your scheduled appointment time.    You need to re-schedule your appointment should you arrive 10 or more minutes late.  We strive to give you quality time with our providers, and arriving late affects you and other patients whose appointments are after yours.  Also, if you no show three or more times for appointments you may be dismissed from the clinic at the providers discretion.     Again, thank you for choosing [facility].  Our hope is that these requests will decrease the amount of time that you wait before being seen by our physicians.       _____________________________________________________________  Should you have questions after your visit to [facility], please contact our office at [phone] between the hours of 8:30 a.m. and 4:30 p.m.  Voicemails left after 4:30 p.m. will not be returned until the following business day.  For prescription refill requests, have your pharmacy contact our office.

## 2014-12-11 LAB — HEPATITIS PANEL, ACUTE
Hep A IgM: NEGATIVE
Hep B C IgM: NEGATIVE
Hepatitis B Surface Ag: NEGATIVE

## 2014-12-11 LAB — HIV ANTIBODY (ROUTINE TESTING W REFLEX): HIV Screen 4th Generation wRfx: NONREACTIVE

## 2014-12-12 NOTE — Progress Notes (Signed)
LABS DRAWN

## 2015-02-08 ENCOUNTER — Other Ambulatory Visit (HOSPITAL_COMMUNITY): Payer: Self-pay | Admitting: Oncology

## 2015-02-08 DIAGNOSIS — J069 Acute upper respiratory infection, unspecified: Secondary | ICD-10-CM

## 2015-02-08 MED ORDER — AMOXICILLIN-POT CLAVULANATE 875-125 MG PO TABS: 1.0000 | ORAL_TABLET | Freq: Two times a day (BID) | ORAL | Status: AC

## 2015-02-15 ENCOUNTER — Ambulatory Visit (HOSPITAL_COMMUNITY): Admission: RE | Admit: 2015-02-15 | Payer: BLUE CROSS/BLUE SHIELD | Source: Ambulatory Visit

## 2015-03-12 ENCOUNTER — Other Ambulatory Visit (HOSPITAL_COMMUNITY): Payer: 59

## 2015-06-10 ENCOUNTER — Other Ambulatory Visit (HOSPITAL_COMMUNITY): Payer: 59

## 2015-06-10 ENCOUNTER — Ambulatory Visit (HOSPITAL_COMMUNITY): Payer: 59 | Admitting: Hematology & Oncology

## 2017-04-13 ENCOUNTER — Ambulatory Visit
Admission: RE | Admit: 2017-04-13 | Discharge: 2017-04-13 | Disposition: A | Discharge status: Home / Self Care | Payer: BLUE CROSS/BLUE SHIELD | Source: Ambulatory Visit | Attending: Internal Medicine | Admitting: Internal Medicine

## 2017-04-13 ENCOUNTER — Other Ambulatory Visit: Payer: Self-pay | Admitting: Internal Medicine

## 2017-04-13 DIAGNOSIS — R059 Cough, unspecified: Secondary | ICD-10-CM

## 2017-04-13 DIAGNOSIS — J209 Acute bronchitis, unspecified: Secondary | ICD-10-CM | POA: Diagnosis not present

## 2017-04-13 DIAGNOSIS — R05 Cough: Secondary | ICD-10-CM | POA: Diagnosis not present

## 2017-05-10 ENCOUNTER — Other Ambulatory Visit: Payer: Self-pay | Admitting: Internal Medicine

## 2017-05-10 ENCOUNTER — Other Ambulatory Visit (HOSPITAL_COMMUNITY)
Admission: RE | Admit: 2017-05-10 | Discharge: 2017-05-10 | Disposition: A | Discharge status: Home / Self Care | Payer: BLUE CROSS/BLUE SHIELD | Source: Ambulatory Visit | Attending: Internal Medicine | Admitting: Internal Medicine

## 2017-05-10 DIAGNOSIS — Z0000 Encounter for general adult medical examination without abnormal findings: Secondary | ICD-10-CM | POA: Diagnosis not present

## 2017-05-10 DIAGNOSIS — Z1231 Encounter for screening mammogram for malignant neoplasm of breast: Secondary | ICD-10-CM | POA: Diagnosis not present

## 2017-05-10 DIAGNOSIS — Z01419 Encounter for gynecological examination (general) (routine) without abnormal findings: Secondary | ICD-10-CM | POA: Insufficient documentation

## 2017-05-10 DIAGNOSIS — E785 Hyperlipidemia, unspecified: Secondary | ICD-10-CM | POA: Diagnosis not present

## 2017-05-10 DIAGNOSIS — Z139 Encounter for screening, unspecified: Secondary | ICD-10-CM

## 2017-05-12 LAB — CYTOLOGY - PAP: Diagnosis: NEGATIVE

## 2017-06-01 ENCOUNTER — Ambulatory Visit
Admission: RE | Admit: 2017-06-01 | Discharge: 2017-06-01 | Disposition: A | Discharge status: Home / Self Care | Payer: BLUE CROSS/BLUE SHIELD | Source: Ambulatory Visit | Attending: Internal Medicine | Admitting: Internal Medicine

## 2017-06-01 DIAGNOSIS — Z1231 Encounter for screening mammogram for malignant neoplasm of breast: Secondary | ICD-10-CM | POA: Diagnosis not present

## 2017-06-01 DIAGNOSIS — Z139 Encounter for screening, unspecified: Secondary | ICD-10-CM

## 2017-06-08 DIAGNOSIS — Z1211 Encounter for screening for malignant neoplasm of colon: Secondary | ICD-10-CM | POA: Diagnosis not present

## 2017-06-08 DIAGNOSIS — D126 Benign neoplasm of colon, unspecified: Secondary | ICD-10-CM | POA: Diagnosis not present

## 2017-06-11 DIAGNOSIS — D126 Benign neoplasm of colon, unspecified: Secondary | ICD-10-CM | POA: Diagnosis not present

## 2017-10-04 DIAGNOSIS — D696 Thrombocytopenia, unspecified: Secondary | ICD-10-CM | POA: Diagnosis not present

## 2017-10-04 DIAGNOSIS — Z111 Encounter for screening for respiratory tuberculosis: Secondary | ICD-10-CM | POA: Diagnosis not present

## 2017-10-04 DIAGNOSIS — Z79899 Other long term (current) drug therapy: Secondary | ICD-10-CM | POA: Diagnosis not present

## 2017-10-04 DIAGNOSIS — E785 Hyperlipidemia, unspecified: Secondary | ICD-10-CM | POA: Diagnosis not present

## 2017-10-06 DIAGNOSIS — Z111 Encounter for screening for respiratory tuberculosis: Secondary | ICD-10-CM | POA: Diagnosis not present

## 2019-02-23 ENCOUNTER — Other Ambulatory Visit: Payer: Self-pay

## 2019-02-23 ENCOUNTER — Ambulatory Visit: Payer: BC Managed Care – PPO | Attending: Internal Medicine

## 2019-02-23 DIAGNOSIS — Z20822 Contact with and (suspected) exposure to covid-19: Secondary | ICD-10-CM

## 2019-02-25 LAB — NOVEL CORONAVIRUS, NAA: SARS-CoV-2, NAA: NOT DETECTED

## 2019-04-16 ENCOUNTER — Ambulatory Visit: Payer: BC Managed Care – PPO | Attending: Internal Medicine

## 2019-04-16 DIAGNOSIS — Z23 Encounter for immunization: Secondary | ICD-10-CM | POA: Insufficient documentation

## 2019-04-16 NOTE — Progress Notes (Signed)
   Covid-19 Vaccination Clinic  Name:  Veronica Johnson    MRN: [id number] DOB: Sep 22, 1965  04/16/2019  [name] was observed post Covid-19 immunization for 15 minutes without incident. She was provided with Vaccine Information Sheet and instruction to access the V-Safe system.   [name] was instructed to call 911 with any severe reactions post vaccine: Marland Kitchen Difficulty breathing  . Swelling of face and throat  . A fast heartbeat  . A bad rash all over body  . Dizziness and weakness   Immunizations Administered    Name Date Dose VIS Date Route   Pfizer COVID-19 Vaccine 04/16/2019  9:46 AM 0.3 mL 01/20/2019 Intramuscular   Manufacturer: [employer], [employer]   Lot: [id number]   NDC: [address]-8

## 2019-05-07 ENCOUNTER — Ambulatory Visit: Payer: Self-pay | Attending: Internal Medicine

## 2019-05-07 DIAGNOSIS — Z23 Encounter for immunization: Secondary | ICD-10-CM

## 2019-05-07 NOTE — Progress Notes (Signed)
   Covid-19 Vaccination Clinic  Name:  DEANIA SIGUENZA    MRN: [id number] DOB: Dec 30, 1965  05/07/2019  Ms. Gasior was observed post Covid-19 immunization for 15 minutes without incident. She was provided with Vaccine Information Sheet and instruction to access the V-Safe system.   Ms. Kalman was instructed to call 911 with any severe reactions post vaccine: Marland Kitchen Difficulty breathing  . Swelling of face and throat  . A fast heartbeat  . A bad rash all over body  . Dizziness and weakness   Immunizations Administered    Name Date Dose VIS Date Route   Pfizer COVID-19 Vaccine 05/07/2019  9:02 AM 0.3 mL 01/20/2019 Intramuscular   Manufacturer: [employer], [employer]   Lot: [id number]   NDC: [address]-4
